# Patient Record
Sex: Male | Born: 1955 | Race: Black or African American | Hispanic: No | Marital: Single | Smoking: Never smoker
Health system: Southern US, Community
[De-identification: ages and names within clinical notes are randomized; demographics above are authoritative.]

## PROBLEM LIST (undated history)

## (undated) DIAGNOSIS — N400 Enlarged prostate without lower urinary tract symptoms: Secondary | ICD-10-CM

## (undated) HISTORY — PX: OTHER SURGICAL HISTORY: SHX169

---

## 2011-07-02 ENCOUNTER — Encounter (HOSPITAL_COMMUNITY): Payer: Self-pay | Admitting: *Deleted

## 2011-07-02 ENCOUNTER — Emergency Department (HOSPITAL_COMMUNITY)
Admission: EM | Admit: 2011-07-02 | Discharge: 2011-07-02 | Disposition: A | Discharge status: Home / Self Care | Payer: No Typology Code available for payment source | Attending: Emergency Medicine | Admitting: Emergency Medicine

## 2011-07-02 DIAGNOSIS — K644 Residual hemorrhoidal skin tags: Secondary | ICD-10-CM | POA: Insufficient documentation

## 2011-07-02 DIAGNOSIS — N390 Urinary tract infection, site not specified: Secondary | ICD-10-CM

## 2011-07-02 DIAGNOSIS — K5900 Constipation, unspecified: Secondary | ICD-10-CM | POA: Insufficient documentation

## 2011-07-02 DIAGNOSIS — R109 Unspecified abdominal pain: Secondary | ICD-10-CM | POA: Insufficient documentation

## 2011-07-02 LAB — URINALYSIS, ROUTINE W REFLEX MICROSCOPIC
Glucose, UA: NEGATIVE mg/dL
Ketones, ur: NEGATIVE mg/dL
Nitrite: NEGATIVE
Specific Gravity, Urine: 1.017 (ref 1.005–1.030)
pH: 5.5 (ref 5.0–8.0)

## 2011-07-02 LAB — URINE MICROSCOPIC-ADD ON

## 2011-07-02 MED ORDER — CEPHALEXIN 500 MG PO CAPS: 500.0000 mg | ORAL_CAPSULE | Freq: Four times a day (QID) | ORAL | Status: AC

## 2011-07-02 MED ORDER — POLYETHYLENE GLYCOL 3350 17 G PO PACK: 17.0000 g | PACK | Freq: Every day | ORAL | Status: AC

## 2011-07-02 NOTE — Discharge Instructions (Signed)
Take antibiotic as prescribed.  Take miralax once a day for the next five days or until you have had a normal bowel movement.  Continue to drink plenty of fluids and increase your fiber intake.  You should return to the ER if you develop fever, worsening pain, uncontrolled vomiting or prolonged constipation.  Constipation in Adults Constipation is having fewer than 2 bowel movements per week. Usually, the stools are hard. As we grow older, constipation is more common. If you try to fix constipation with laxatives, the problem may get worse. This is because laxatives taken over a long period of time make the colon muscles weaker. A low-fiber diet, not taking in enough fluids, and taking some medicines may make these problems worse. MEDICATIONS THAT MAY CAUSE CONSTIPATION  Water pills (diuretics).   Calcium channel blockers (used to control blood pressure and for the heart).   Certain pain medicines (narcotics).   Anticholinergics.   Anti-inflammatory agents.   Antacids that contain aluminum.  DISEASES THAT CONTRIBUTE TO CONSTIPATION  Diabetes.   Parkinson's disease.   Dementia.   Stroke.   Depression.   Illnesses that cause problems with salt and water metabolism.  HOME CARE INSTRUCTIONS   Constipation is usually best cared for without medicines. Increasing dietary fiber and eating more fruits and vegetables is the best way to manage constipation.   Slowly increase fiber intake to 25 to 38 grams per day. Whole grains, fruits, vegetables, and legumes are good sources of fiber. A dietitian can further help you incorporate high-fiber foods into your diet.   Drink enough water and fluids to keep your urine clear or pale yellow.   A fiber supplement may be added to your diet if you cannot get enough fiber from foods.   Increasing your activities also helps improve regularity.   Suppositories, as suggested by your caregiver, will also help. If you are using antacids, such as aluminum  or calcium containing products, it will be helpful to switch to products containing magnesium if your caregiver says it is okay.   If you have been given a liquid injection (enema) today, this is only a temporary measure. It should not be relied on for treatment of longstanding (chronic) constipation.   Stronger measures, such as magnesium sulfate, should be avoided if possible. This may cause uncontrollable diarrhea. Using magnesium sulfate may not allow you time to make it to the bathroom.  SEEK IMMEDIATE MEDICAL CARE IF:   There is bright red blood in the stool.   The constipation stays for more than 4 days.   There is belly (abdominal) or rectal pain.   You do not seem to be getting better.   You have any questions or concerns.  MAKE SURE YOU:   Understand these instructions.   Will watch your condition.   Will get help right away if you are not doing well or get worse.  Document Released: 11/23/2003 Document Revised: 02/13/2011 Document Reviewed: 01/28/2011 [facility] Patient Information 2012 [place], [place].

## 2011-07-02 NOTE — ED Provider Notes (Signed)
History     CSN: [id number]  Arrival date & time 07/02/11  1013   First MD Initiated Contact with Patient 07/02/11 1112      Chief Complaint  Patient presents with  . Abdominal Pain  . Constipation    (Consider location/radiation/quality/duration/timing/severity/associated sxs/prior treatment) HPI History provided by pt.   Pt developed diffuse, sharp pain across lower abdomen 5 days ago.   Pain had been constant but resolved and has not experienced any pain this morning.  Associated w/ constipation.  Has not had a BM in 3 days and for 3-4 days before that, his stools were small, hard balls.  Has not taken anything for symptoms but consumes large amt water and fiber.  Has not had fever, N/V, hematemesis/hematochezia/melena, urinary sx, urethral discharge.  No PMH other than mildly enlarged prostate.  No h/o abdominal surgeries.    History reviewed. No pertinent past medical history.  History reviewed. No pertinent past surgical history.  No family history on file.  History  Substance Use Topics  . Smoking status: Never Smoker   . Smokeless tobacco: Not on file  . Alcohol Use: Yes      Review of Systems  All other systems reviewed and are negative.    Allergies  Review of patient's allergies indicates no known allergies.  Home Medications   Current Outpatient Rx  Name Route Sig Dispense Refill  . BRIMONIDINE TARTRATE 0.1 % OP SOLN Both Eyes Place 1 drop into both eyes 2 (two) times daily.    Marland Kitchen LATANOPROST 0.005 % OP SOLN Both Eyes Place 1 drop into both eyes at bedtime.    Marland Kitchen TIMOLOL HEMIHYDRATE 0.5 % OP SOLN Left Eye Place 1 drop into the left eye daily.      BP 115/73  Pulse 74  Temp(Src) 98.6 F (37 C) (Oral)  Resp 20  Ht 5\' 7"  (1.702 m)  Wt 155 lb (70.308 kg)  BMI 24.28 kg/m2  SpO2 98%  Physical Exam  Nursing note and vitals reviewed. Constitutional: He is oriented to person, place, and time. He appears well-developed and well-nourished. No distress.    HENT:  Head: Normocephalic and atraumatic.  Eyes:       Normal appearance  Neck: Normal range of motion.  Cardiovascular: Normal rate and regular rhythm.   Pulmonary/Chest: Effort normal and breath sounds normal. No respiratory distress.  Abdominal: Soft. Bowel sounds are normal. He exhibits no distension and no mass. There is no tenderness. There is no rebound and no guarding.  Genitourinary:       One, non-thromboses external hemorrhoid.  No stool or blood in rectum.  Prostate does not seem to be enlarged.  Non-tender.   Musculoskeletal: Normal range of motion.  Neurological: He is alert and oriented to person, place, and time.  Skin: Skin is warm and dry. No rash noted.  Psychiatric: He has a normal mood and affect. His behavior is normal.    ED Course  Procedures (including critical care time)  Labs Reviewed  URINALYSIS, ROUTINE W REFLEX MICROSCOPIC - Abnormal; Notable for the following:    Hgb urine dipstick TRACE (*)    Leukocytes, UA SMALL (*)    All other components within normal limits  URINE MICROSCOPIC-ADD ON   No results found.   1. Urinary tract infection   2. Constipation       MDM  Healthy 56yo M, recently moved to area, presents w/ c/o lower abd pain and constipation.   Afebrile, NAD, abd benign/non-tender,  nml rectum/prostate on exam.  U/A positive for infection.  Pain may be d/t UTI or constipation. Doubt SBO; no h/o abd surgeries, no N/V, abd soft w/ NBS.  Doubt prostatitis; no urinary sx or low back pain, afebrile and prostate nml size/non-tender.  Pt d/c'd home w/ miralax and keflex as well as referral to [facility].  Return precautions discussed.         Arie Sabina [place], [place] 07/02/11 1616

## 2011-07-02 NOTE — ED Notes (Signed)
Patient with reported abd pain and constipation for 3 days.  Patient denies nausea.  Patient denies hx of same.  Patient has not eaten today

## 2011-07-02 NOTE — ED Notes (Signed)
Pt getting undressed and into a gown; warm blankets

## 2011-07-02 NOTE — ED Provider Notes (Signed)
Medical screening examination/treatment/procedure(s) were performed by non-physician practitioner and as supervising physician I was immediately available for consultation/collaboration.    Kyndall Amero L Yoana Staib, MD 07/02/11 2013 

## 2011-07-04 ENCOUNTER — Emergency Department (HOSPITAL_COMMUNITY)
Admission: EM | Admit: 2011-07-04 | Discharge: 2011-07-05 | Disposition: A | Discharge status: Home / Self Care | Payer: No Typology Code available for payment source | Attending: Emergency Medicine | Admitting: Emergency Medicine

## 2011-07-04 ENCOUNTER — Emergency Department (HOSPITAL_COMMUNITY): Payer: No Typology Code available for payment source

## 2011-07-04 ENCOUNTER — Encounter (HOSPITAL_COMMUNITY): Payer: Self-pay | Admitting: *Deleted

## 2011-07-04 DIAGNOSIS — K5900 Constipation, unspecified: Secondary | ICD-10-CM | POA: Insufficient documentation

## 2011-07-04 DIAGNOSIS — N134 Hydroureter: Secondary | ICD-10-CM | POA: Insufficient documentation

## 2011-07-04 DIAGNOSIS — R109 Unspecified abdominal pain: Secondary | ICD-10-CM | POA: Insufficient documentation

## 2011-07-04 DIAGNOSIS — N289 Disorder of kidney and ureter, unspecified: Secondary | ICD-10-CM | POA: Insufficient documentation

## 2011-07-04 DIAGNOSIS — R1032 Left lower quadrant pain: Secondary | ICD-10-CM | POA: Insufficient documentation

## 2011-07-04 DIAGNOSIS — R338 Other retention of urine: Secondary | ICD-10-CM | POA: Insufficient documentation

## 2011-07-04 LAB — COMPREHENSIVE METABOLIC PANEL
AST: 23 U/L (ref 0–37)
Albumin: 3.8 g/dL (ref 3.5–5.2)
BUN: 21 mg/dL (ref 6–23)
Chloride: 101 mEq/L (ref 96–112)
Creatinine, Ser: 2.51 mg/dL — ABNORMAL HIGH (ref 0.50–1.35)
Potassium: 3.6 mEq/L (ref 3.5–5.1)
Total Protein: 7.6 g/dL (ref 6.0–8.3)

## 2011-07-04 LAB — URINALYSIS, ROUTINE W REFLEX MICROSCOPIC
Glucose, UA: NEGATIVE mg/dL
Hgb urine dipstick: NEGATIVE
Leukocytes, UA: NEGATIVE
pH: 5.5 (ref 5.0–8.0)

## 2011-07-04 LAB — DIFFERENTIAL
Basophils Absolute: 0 10*3/uL (ref 0.0–0.1)
Basophils Relative: 0 % (ref 0–1)
Eosinophils Absolute: 0.1 10*3/uL (ref 0.0–0.7)
Monocytes Absolute: 0.7 10*3/uL (ref 0.1–1.0)
Monocytes Relative: 9 % (ref 3–12)
Neutro Abs: 5.9 10*3/uL (ref 1.7–7.7)
Neutrophils Relative %: 72 % (ref 43–77)

## 2011-07-04 LAB — CBC
Hemoglobin: 13.2 g/dL (ref 13.0–17.0)
MCH: 29.2 pg (ref 26.0–34.0)
MCHC: 33.2 g/dL (ref 30.0–36.0)
RDW: 12.1 % (ref 11.5–15.5)

## 2011-07-04 MED ORDER — SODIUM CHLORIDE 0.9 % IV SOLN
INTRAVENOUS | Status: DC
  Administered 2011-07-04: 125 mL/h via INTRAVENOUS
  Administered 2011-07-04: 19:00:00 via INTRAVENOUS

## 2011-07-04 MED ORDER — LIDOCAINE HCL 2 % EX GEL
CUTANEOUS | Status: AC
  Filled 2011-07-04: qty 20

## 2011-07-04 MED ORDER — IOHEXOL 300 MG/ML  SOLN
100.0000 mL | Freq: Once | INTRAMUSCULAR | Status: AC | PRN
  Administered 2011-07-04: 100 mL via INTRAVENOUS

## 2011-07-04 MED ORDER — ONDANSETRON HCL 4 MG/2ML IJ SOLN
4.0000 mg | Freq: Once | INTRAMUSCULAR | Status: AC
  Administered 2011-07-04: 4 mg via INTRAVENOUS
  Filled 2011-07-04: qty 2

## 2011-07-04 NOTE — ED Notes (Signed)
Pt was seen for constipation and UTI on Wednesday.  Pt continues to have constipation, only able to make small little hard "droplets".  Pt feels that his abdomen is distended.  No relief from constipation with laxatives at home.  No nausea or vomiting with this

## 2011-07-04 NOTE — ED Provider Notes (Signed)
History     CSN: [id number]  Arrival date & time 07/04/11  1428   First MD Initiated Contact with Patient 07/04/11 1728      Chief Complaint  Patient presents with  . Constipation    (Consider location/radiation/quality/duration/timing/severity/associated sxs/prior treatment) HPI Comments: Alan Brewer is a 56 y.o. Male with abdominal swelling, constipation, decreased stooling, urinary hesitancy, and decreased oral intake. He has been symptomatic for several days. He's been using MiraLAX recommended after an emergency department visit without relief. He has never had this problem before. He is taking over-the-counter vitamins and iron in them.   Patient is a 56 y.o. male presenting with constipation. The history is provided by the patient.  Constipation     History reviewed. No pertinent past medical history.  History reviewed. No pertinent past surgical history.  No family history on file.  History  Substance Use Topics  . Smoking status: Never Smoker   . Smokeless tobacco: Not on file  . Alcohol Use: Yes      Review of Systems  Gastrointestinal: Positive for constipation.  All other systems reviewed and are negative.    Allergies  Review of patient's allergies indicates no known allergies.  Home Medications   Current Outpatient Rx  Name Route Sig Dispense Refill  . BRIMONIDINE TARTRATE 0.1 % OP SOLN Both Eyes Place 1 drop into both eyes 2 (two) times daily.    . CEPHALEXIN 500 MG PO CAPS Oral Take 1 capsule (500 mg total) by mouth 4 (four) times daily. 28 capsule 0  . LATANOPROST 0.005 % OP SOLN Both Eyes Place 1 drop into both eyes at bedtime.    Marland Kitchen POLYETHYLENE GLYCOL 3350 PO PACK Oral Take 17 g by mouth daily. 5 each 0  . TIMOLOL HEMIHYDRATE 0.5 % OP SOLN Left Eye Place 1 drop into the left eye daily.    Marland Kitchen CIPROFLOXACIN HCL 500 MG PO TABS Oral Take 1 tablet (500 mg total) by mouth every 12 (twelve) hours. 20 tablet 0    BP 123/71  Pulse 70   Temp(Src) 99.2 F (37.3 C) (Oral)  Resp 16  SpO2 96%  Physical Exam  Nursing note and vitals reviewed. Constitutional: He is oriented to person, place, and time. He appears well-developed and well-nourished.  HENT:  Head: Normocephalic and atraumatic.  Right Ear: External ear normal.  Left Ear: External ear normal.  Eyes: Conjunctivae and EOM are normal. Pupils are equal, round, and reactive to light.  Neck: Normal range of motion and phonation normal. Neck supple.  Cardiovascular: Normal rate, regular rhythm, normal heart sounds and intact distal pulses.   Pulmonary/Chest: Effort normal and breath sounds normal. He exhibits no bony tenderness.  Abdominal: Soft. Normal appearance and bowel sounds are normal. There is no tenderness.       Distended, soft.  Musculoskeletal: Normal range of motion.  Neurological: He is alert and oriented to person, place, and time. He has normal strength. No cranial nerve deficit or sensory deficit. He exhibits normal muscle tone. Coordination normal.  Skin: Skin is warm, dry and intact.  Psychiatric: He has a normal mood and affect. His behavior is normal. Judgment and thought content normal.    ED Course  Procedures (including critical care time)  Labs Reviewed  COMPREHENSIVE METABOLIC PANEL - Abnormal; Notable for the following:    Glucose, Bld 112 (*)    Creatinine, Ser 2.51 (*)    GFR calc non Af Amer 27 (*)    GFR calc Af  Amer 31 (*)    All other components within normal limits  CBC  DIFFERENTIAL  URINALYSIS, ROUTINE W REFLEX MICROSCOPIC  URINE CULTURE   Ct Abdomen Pelvis W Contrast  07/04/2011  *RADIOLOGY REPORT*  Clinical Data: No BM for 1 week  CT ABDOMEN AND PELVIS WITH CONTRAST  Technique:  Multidetector CT imaging of the abdomen and pelvis was performed following the standard protocol during bolus administration of intravenous contrast.  Contrast:  100 ml of omni 300  Comparison: None  Findings: The lung bases appear clear.  No  pericardial or pleural effusion.  Several low density structures are noted within the liver parenchyma.  These are too small to characterize.  The gallbladder is normal.  There is no biliary dilatation.  The pancreas is unremarkable.  Negative spleen.  Both adrenal glands are normal.  There are two small stones within the right upper and lower pole collecting system.  Bilateral peri nephric fat stranding is noted. Within the upper pole of the left kidney there is a 7.6 mm hypodense structure.  Too small to reliably characterize.  There is bilateral hydronephrosis and hydroureter which leads up to the urinary bladder. No ureteral calculi identified. There is moderate to marked distention of the urinary bladder which exhibits diffuse wall thickening.  Prostate gland appears irregular and enlarged. Suspect bladder outlet obstruction.   Within the dependent portion of the bladder there is a tiny 2-3 mm stone, image 65.  No upper abdominal adenopathy present.  There is no pelvic or inguinal adenopathy.  The stomach appears normal.  The small bowel loops are normal in caliber.  No evidence for small bowel obstruction.  There is a moderate desiccated stool within the proximal colon.  No evidence for large bowel obstruction.  There is however mass effect of the distended bladder onto the sigmoid colon.  Small amount of free fluid is noted within the pelvis.  Review of the visualized osseous structures is unremarkable.  IMPRESSION:  1.  Suspect a bladder outlet obstruction.  This may be chronic. The urinary bladder is moderate to markedly distended and there is mild diffuse wall thickening of the bladder. The distended urinary bladder does exhibit mass effect upon the sigmoid colon which may account for the change in bowel habits.  2.  Bilateral hydronephrosis and perinephric fat stranding is likely secondary to chronic bladder outlet obstruction. 3.  Small nonobstructing calculi are noted within the right renal collecting  system.  A third stone is identified within the dependent portion of the bladder. 4.  No evidence for acute bowel pathology.  Specifically there is no evidence for colitis or bowel obstruction.  Original Report Authenticated By: Rosealee Albee, M.D.   Dg Abd 2 Views  07/04/2011  *RADIOLOGY REPORT*  Clinical Data: Left lower quadrant pain.  Abdominal distention.  ABDOMEN - 2 VIEW  Comparison: None.  Findings: Several mildly dilated small bowel loops are seen in the upper abdomen with air-fluid levels.  There is a paucity of colonic gas.  This could represent a focal ileus or a partial proximal to mid small bowel obstruction.  There is no evidence of free air.  No radiopaque calculi identified.  Pelvic phleboliths are noted as well as ingested radiopaque substance in the right abdomen.  IMPRESSION: Focal abdominal small bowel ileus, versus partial proximal to mid small bowel obstruction.  Original Report Authenticated By: Danae Orleans, M.D.     1. Acute urinary retention    2. Acute Renal Insufficiency  MDM  Abdominal distension with constipation and urinary hesitancy. Under treatment for UTI. GI sx with non-diagnostic plain imaging, required CT A/P. He has renal insufficiency, apparently new, and bladder distension. He will need bladder drainage prior to d/c and appropriate f/u planning with Urology. Placed in CDU on Holding status by me.           Flint Melter, MD 07/05/11 [phone]

## 2011-07-04 NOTE — ED Notes (Signed)
Pt back from CT

## 2011-07-04 NOTE — ED Notes (Signed)
Patient states has not had a BM for one week seen in ED two days ago given medication. Returned today for general abdominal pain more on the right then left.  Abdomen moderate firm and distended. Hypoactive bowel sounds.  Patient states eating without incident.  Did have two bowel movement formed stated small amounts. Airway intact bilateral equal chest rise and fall wife at bedside.

## 2011-07-04 NOTE — ED Notes (Signed)
Foley cath inserted with no urine return at this time.  Pt had just voided prior to foley cath placement.  Wife at bedside.

## 2011-07-04 NOTE — ED Notes (Signed)
Pt continues to not have any urine.  Felicie Morn NP made aware.  Coude cath ordered and Dr. Patria Mane will attempt to insert.

## 2011-07-05 LAB — URINE CULTURE
Colony Count: NO GROWTH
Culture  Setup Time: 201304261849
Culture: NO GROWTH

## 2011-07-05 MED ORDER — CIPROFLOXACIN HCL 500 MG PO TABS: 500.0000 mg | ORAL_TABLET | Freq: Two times a day (BID) | ORAL | Status: AC

## 2011-07-05 MED ORDER — CIPROFLOXACIN HCL 500 MG PO TABS
500.0000 mg | ORAL_TABLET | Freq: Once | ORAL | Status: AC
  Administered 2011-07-05: 500 mg via ORAL
  Filled 2011-07-05: qty 1

## 2011-07-05 NOTE — ED Notes (Signed)
Coude Cath inserted by Dr. Patria Mane with 1200 urine return.  Leg bag placed on pt and instructed on how to use. Pt and wife verbalizes understanding

## 2011-07-05 NOTE — ED Provider Notes (Signed)
Patient moved to CDU from triage for continuation of treatment of urinary retention and constipation.  Nursing tech attempted to insert foley cath, did not get any urine return from catheter, but has frank blood from urethra.  Suspect catheter did not pass by prostate.  Discussed with Dr. Kayleen Memos evaluated with bedside ultrasound, bladder continues to remain distended.  Coude catheter inserted by Dr. Patria Mane with return of large amount of yellow urine.  Patient will be started on cipro for 10 days.  Patient to follow-up with Dr. Vernie Ammons on Monday.  Jimmye Norman, NP 07/05/11 [phone]

## 2011-07-05 NOTE — ED Provider Notes (Signed)
Medical screening examination/treatment/procedure(s) were conducted as a shared visit with non-physician practitioner(s) and myself.  I personally evaluated the patient during the encounter  The patient appears to have urinary retention as the cause of his symptoms.  A coud Foley catheter was placed with return of approximately 1 L of yellow urine.  Patient be switched to 10 days of ciprofloxacin.  Urology followup.  Urine culture sent.  His abdominal distention resolved with placement of the Foley.  I suspect his urinary retention as been the cause of his constipation.  PROCEDURE NOTE: Foley Catheter placement A 16 French coud Foley catheter was placed under normal sterile conditions.  10 cc of saline were placed and the balloon.  The patient tolerated the procedure well without any complications.  Return her 1 L of yellow urine.  Foley catheter placed to leg bag.  Lyanne Co, MD 07/05/11 [phone]

## 2011-07-05 NOTE — Discharge Instructions (Signed)
Acute Urinary Retention, Male  You have been seen by a caregiver today because of your inability to urinate (pass your water).  This is a common problem in elderly males. As men age their prostates become larger and block the flow of urine from the bladder. This is usually a problem that has come on gradually. It is often first noticed by having to get up at night to urinate. This is because as the prostate enlarges it is more difficult to empty the bladder completely.  Treatment may involve a one time catheterization to empty the bladder. This is putting in a tube to drain your urine. Then you and your personal caregiver can decide at your earliest convenience how to handle this problem in the future. It may also be a problem that may not recur for years. Sometimes this problem can be caused by medications. In this case, all that is often necessary is to discontinue the offending agent.  If you are to leave the foley catheter (a long, narrow, hollow tube) in and go home with a drainage system, you will need to discuss the best course of action with your caregiver. While the catheter is in, maintain a good intake of fluids. Keep the drainage bag emptied and lower than your catheter. This is so contaminated (infected) urine will not be flowing back into your bladder. This could lead to a urinary tract infection.  Only take over-the-counter or prescription medicines for pain, discomfort, or fever as directed by your caregiver.   SEEK IMMEDIATE MEDICAL CARE IF:   You develop chills, fever, or show signs of generalized illness that occurs prior to seeing your caregiver.  Document Released: 06/02/2000 Document Revised: 02/13/2011 Document Reviewed: 02/16/2008  [employer] Patient Information 2012 [employer]      Foley Catheter Care, Adult  A soft, flexible tube (Foley catheter) has been placed in your bladder. This may be done to temporarily help with urine drainage after an operation or to relieve blockage from an  enlarged prostate gland.  HOME CARE INSTRUCTIONS   If you are going home with a Foley catheter in place, follow these instructions:  Taking Care of the Catheter:   Keep the area where the catheter leaves your body clean.   Attach the catheter to the leg so there is no tension on the catheter.   Keep the drainage bag below the level of the bladder, but keep it OFF the floor.   Do not take long soaking baths. Your caregiver will give instructions about showering.   Wash your hands before touching ANYTHING related to the catheter or bag.   Using mild soap and warm water on a washcloth:   Clean the area closest to the catheter insertion site using a circular motion around the catheter.   Clean the catheter itself by wiping AWAY from the insertion site for several inches down the tube.   NEVER wipe upward as this could sweep bacteria up into the urethra (tube in your body that normally drains the bladder) and cause infection.  Taking Care of the Drainage Bags:   Two drainage bags will be taken home: a large overnight drainage bag, and a smaller leg bag which fits underneath clothing.   It is okay to wear the overnight bag at any time, but NEVER wear the smaller leg bag at night.   Keep the drainage bag well below the level of your bladder. This prevents backflow of urine into the bladder and allows the urine to drain   or a leg strap provided by the hospital.   Empty the drainage bag when it is  to  full. Wash your hands before and after touching the bag.   Periodically check the tubing for kinks to make sure there is no pressure on the tubing which could restrict the flow of urine.  Changing the Drainage Bags:  Cleanse both ends of the clean bag with alcohol before changing.   Pinch off the rubber catheter to avoid urine spillage during the disconnection.   Disconnect the dirty bag and  connect the clean one.   Empty the dirty bag carefully to avoid a urine spill.   Attach the new bag to the leg with tape or a leg strap.  Cleaning the Drainage Bags:  Whenever a drainage bag is disconnected, it must be cleaned quickly so it is ready for the next use.   Wash the bag in warm, soapy water.   Rinse the bag thoroughly with warm water.   Soak the bag for 30 minutes in a solution of white vinegar and water (1 cup vinegar to 1 quart warm water).   Rinse with warm water.  SEEK MEDICAL CARE IF:   Some pain develops in the kidney (lower back) area.   The urine is cloudy or smells bad.   There is some blood in the urine.   The catheter becomes clogged and/or there is no urine drainage.  SEEK IMMEDIATE MEDICAL CARE IF:   You have moderate or severe pain in the kidney region.   You start to throw up (vomit).   Blood fills the tube.   Worsening belly (abdominal) pain develops.   You have a fever.  MAKE SURE YOU:   Understand these instructions.   Will watch your condition.   Will get help right away if you are not doing well or get worse.  Document Released: 02/24/2005 Document Revised: 02/13/2011 Document Reviewed: 08/21/2006 [facility] Patient Information 2012 [place], [place].  RESOURCE GUIDE     No Primary Care Doctor Call [facility]  [phone] Other agencies that provide inexpensive medical care    [facility]  [phone]    [facility]  [phone]    [facility]  [phone]    [facility]  [phone]    [facility]  [phone]    [facility]  [phone]

## 2013-10-03 IMAGING — CR DG ABDOMEN 2V
2 series · 2 of 2 positions shown · non-contrast
Comparison: None.

CLINICAL DATA: Left lower quadrant pain.  Abdominal distention.

ABDOMEN - 2 VIEW

[t abdomen supine]
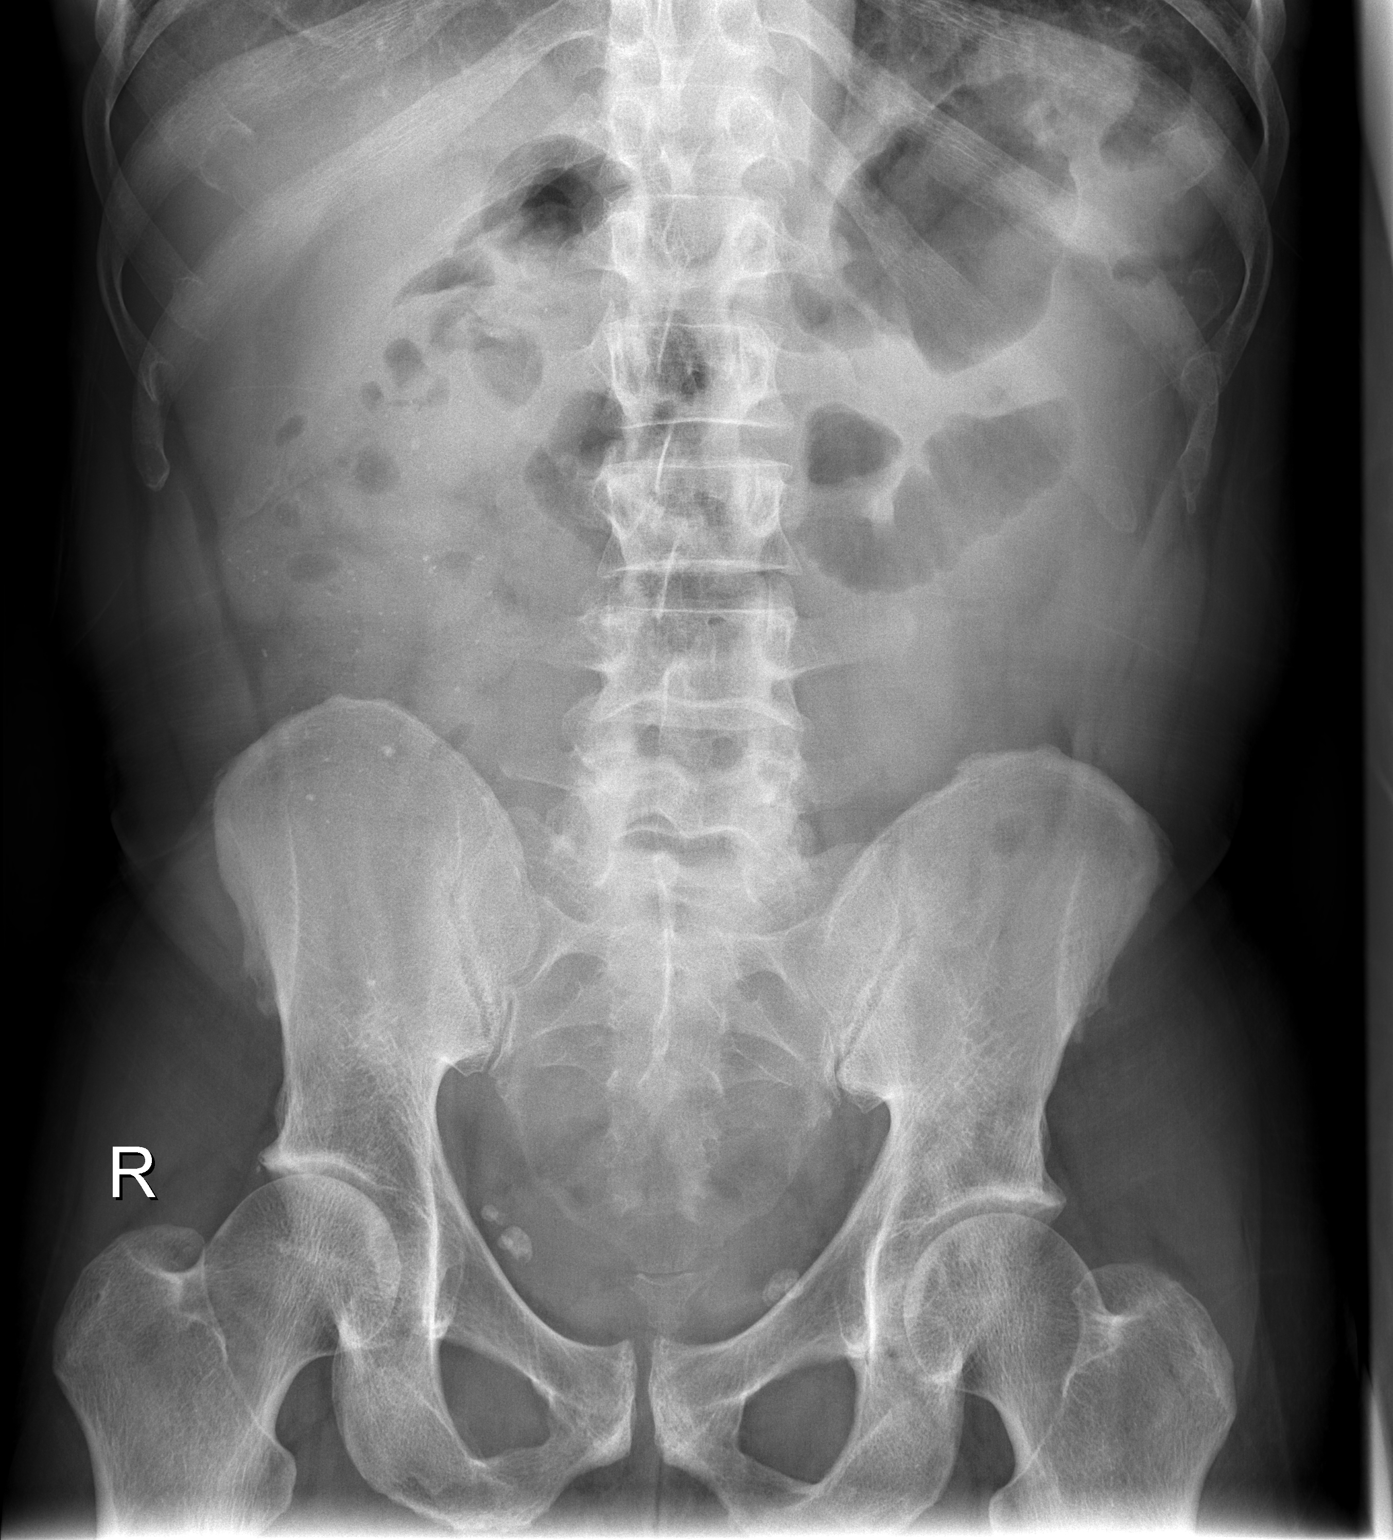

[w abdomen upright]
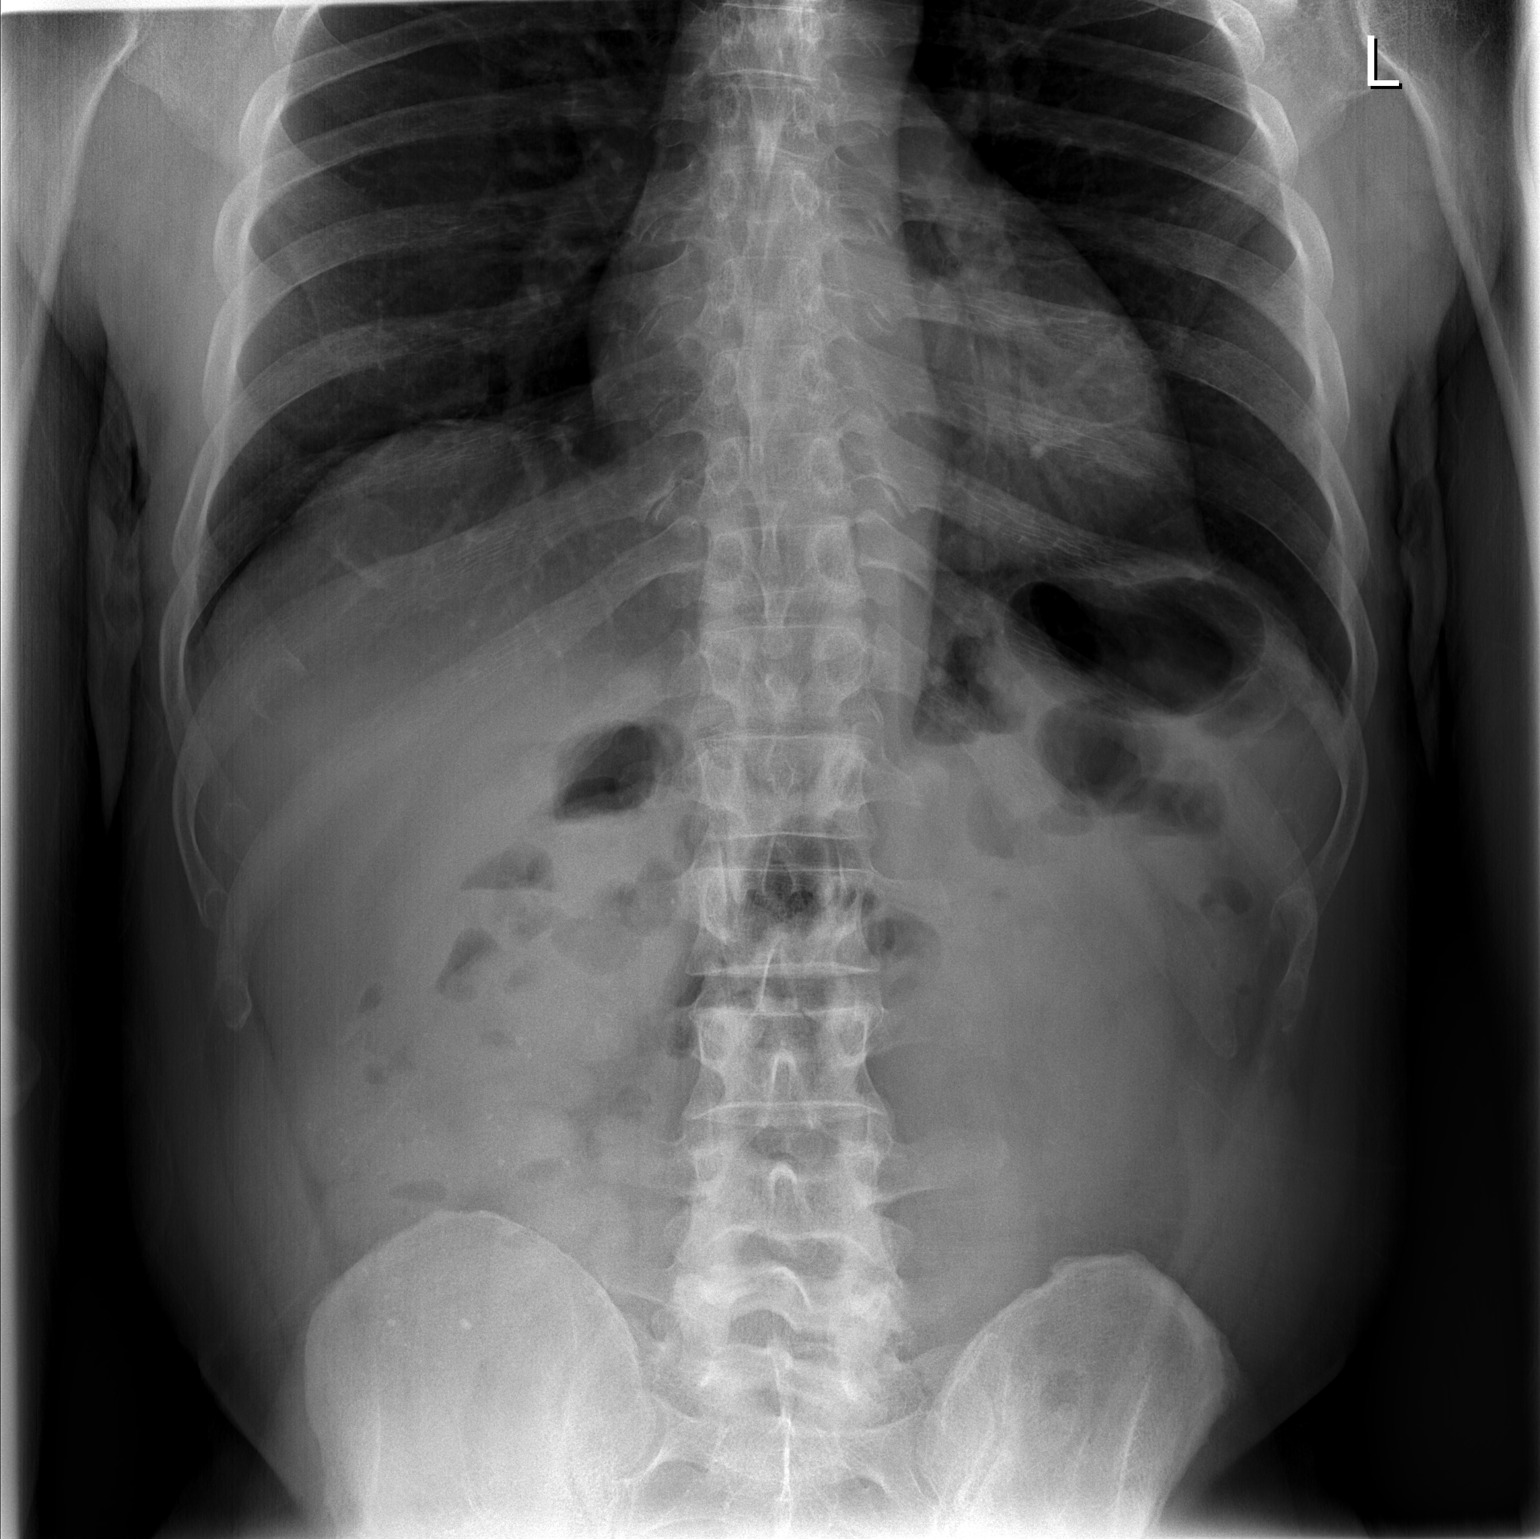

[2 of 2 positions shown; findings below may reference images not displayed]

FINDINGS: Several mildly dilated small bowel loops are seen in the
upper abdomen with air-fluid levels.  There is a paucity of colonic
gas.  This could represent a focal ileus or a partial proximal to
mid small bowel obstruction.  There is no evidence of free air.  No
radiopaque calculi identified.  Pelvic phleboliths are noted as
well as ingested radiopaque substance in the right abdomen.
IMPRESSION: Focal abdominal small bowel ileus, versus partial proximal to mid
small bowel obstruction.

## 2015-07-03 ENCOUNTER — Other Ambulatory Visit: Payer: Self-pay | Admitting: Urology

## 2015-07-17 NOTE — Patient Instructions (Addendum)
Tana FeltsJessie Lebron  07/17/2015   Your procedure is scheduled on: 07-24-15  [facility]  [facility] 3rd floor to  Short Stay Center at 530  AM.  Call this number if you have problems the morning of surgery [phone]   Remember: ONLY 1 PERSON MAY GO WITH YOU TO SHORT STAY TO GET  READY MORNING OF YOUR SURGERY.  Do not eat food or drink liquids :After Midnight.     Take these medicines the morning of surgery with A SIP OF WATER: eye drop, finasteride (proscar), tamsulosin (flomax)             You may not have any metal on your body including hair pins and              piercings  Do not wear jewelry, make-up, lotions, powders or perfumes, deodorant             Do not wear nail polish.  Do not shave  48 hours prior to surgery.              Men may shave face and neck.   Do not bring valuables to the hospital. [place] IS NOT             RESPONSIBLE   FOR VALUABLES.  Contacts, dentures or bridgework may not be worn into surgery.  Leave suitcase in the car. After surgery it may be brought to your room.     Patients discharged the day of surgery will not be allowed to drive home.  Name and phone number of your driver:  Special Instructions: N/A              Please read over the following fact sheets you were given: _____________________________________________________________________             [facility] - Preparing for [facility] Before surgery, you can play an important role.  Because skin is not sterile, your skin needs to be as free of germs as possible.  You can reduce the number of germs on your skin by washing with CHG (chlorahexidine gluconate) soap before surgery.  CHG is an antiseptic cleaner which kills germs and bonds with the skin to continue killing germs even after washing. Please DO NOT use if you have an allergy to CHG or antibacterial soaps.  If your skin becomes reddened/irritated stop using the CHG and  inform your nurse when you arrive at Short Stay. Do not shave (including legs and underarms) for at least 48 hours prior to the first CHG shower.  You may shave your face/neck. Please follow these instructions carefully:  1.  Shower with CHG Soap the night before surgery and the  morning of Surgery.  2.  If you choose to wash your hair, wash your hair first as usual with your  normal  shampoo.  3.  After you shampoo, rinse your hair and body thoroughly to remove the  shampoo.                           4.  Use CHG as you would any other liquid soap.  You can apply chg directly  to the skin and wash                       Gently with a  scrungie or clean washcloth.  5.  Apply the CHG Soap to your body ONLY FROM THE NECK DOWN.   Do not use on face/ open                           Wound or open sores. Avoid contact with eyes, ears mouth and genitals (private parts).                       Wash face,  Genitals (private parts) with your normal soap.             6.  Wash thoroughly, paying special attention to the area where your surgery  will be performed.  7.  Thoroughly rinse your body with warm water from the neck down.  8.  DO NOT shower/wash with your normal soap after using and rinsing off  the CHG Soap.                9.  Pat yourself dry with a clean towel.            10.  Wear clean pajamas.            11.  Place clean sheets on your bed the night of your first shower and do not  sleep with pets. Day of Surgery : Do not apply any lotions/deodorants the morning of surgery.  Please wear clean clothes to the hospital/surgery center.  FAILURE TO FOLLOW THESE INSTRUCTIONS MAY RESULT IN THE CANCELLATION OF YOUR SURGERY PATIENT SIGNATURE_________________________________  NURSE SIGNATURE__________________________________  ________________________________________________________________________

## 2015-07-19 ENCOUNTER — Encounter (HOSPITAL_COMMUNITY)
Admission: RE | Admit: 2015-07-19 | Discharge: 2015-07-19 | Disposition: A | Discharge status: Home / Self Care | Payer: 59 | Source: Ambulatory Visit | Attending: Urology | Admitting: Urology

## 2015-07-19 ENCOUNTER — Encounter (HOSPITAL_COMMUNITY): Payer: Self-pay

## 2015-07-19 DIAGNOSIS — Z01812 Encounter for preprocedural laboratory examination: Secondary | ICD-10-CM | POA: Diagnosis not present

## 2015-07-19 DIAGNOSIS — N400 Enlarged prostate without lower urinary tract symptoms: Secondary | ICD-10-CM | POA: Insufficient documentation

## 2015-07-19 HISTORY — DX: Benign prostatic hyperplasia without lower urinary tract symptoms: N40.0

## 2015-07-19 LAB — CBC
HCT: 41.2 % (ref 39.0–52.0)
Hemoglobin: 13.4 g/dL (ref 13.0–17.0)
MCH: 28.6 pg (ref 26.0–34.0)
MCHC: 32.5 g/dL (ref 30.0–36.0)
MCV: 88 fL (ref 78.0–100.0)
PLATELETS: 206 10*3/uL (ref 150–400)
RBC: 4.68 MIL/uL (ref 4.22–5.81)
RDW: 12.4 % (ref 11.5–15.5)
WBC: 5.2 10*3/uL (ref 4.0–10.5)

## 2015-07-19 LAB — BASIC METABOLIC PANEL
ANION GAP: 9 (ref 5–15)
BUN: 13 mg/dL (ref 6–20)
CALCIUM: 9.4 mg/dL (ref 8.9–10.3)
CO2: 26 mmol/L (ref 22–32)
Chloride: 107 mmol/L (ref 101–111)
Creatinine, Ser: 0.96 mg/dL (ref 0.61–1.24)
GFR calc Af Amer: >60 mL/min (ref >60)
GLUCOSE: 96 mg/dL (ref 65–99)
POTASSIUM: 4.1 mmol/L (ref 3.5–5.1)
SODIUM: 142 mmol/L (ref 135–145)

## 2015-07-23 NOTE — H&P (Signed)
History of Present Illness                 F/u -       1-BPH- h/o AUR.     -May 2013 AUR - failed void trial and underwent cystoscopy and the foley catheter was not replaced following the procedure.   Cysto - lateral and median lobe hypertrophy. Normal DRE.   Prostate US = 66 g prostate with median lobe. Renal US showed no right hydro and mild left hydro. BUN 7, Cr 1.2 after foley drainage.   After that time, he had frequent, small volume voids only. Came back with PVR >53600ml, foley catheter replaced. Increased his tamsulosin 0.4mg  to 2 capsules po qhs. Passed void trial.    -Jan 2014 added finasteride to tamsulosin, weak stream  -Feb 2017 - on finasteride and tamsulosin      2-PCa screening- no FH PCa. No prostate bx.   -May 2012 PSA 4.6  -May 2013 normal DRE, 66 gram prostate on U/s   -Jan 2014 PSA 2.34 - on finasteride  -Jan 2015 PSA 2.96   -Feb 2017 normal DRE   -Apr 2017 PSA 2.6      3-  Impotence -  -Jan 2014 erections worse on finasteride. Robinette Hainesried Stendra which didn't work well. Tried Cialis 10-20 mg. He is using Cialis 20 mg. He gets a partial response. Did well with generic sildenafil.   Current: sildenafil 20 mg tablets with good result        April 2017 interval history  Patient returned to continue management of BPH with lower urinary tract symptoms. He returns today for cystoscopy. He continues combination therapy. His current PSA is 2.6 and looking back that appears stable.    Past Medical History Problems  1. History of glaucoma (J81.19(Z86.69)  Surgical History Problems  1. History of No Surgical Problems  Current Meds 1. Brimonidine Tartrate SOLN;  Therapy: (Recorded:02May2013) to Recorded 2. Finasteride 5 MG Oral Tablet; Take 1 tablet by mouth daily;  Therapy: 17Jan2014 to (Evaluate:08Feb2018)  Requested for: 13Feb2017; Last  Rx:13Feb2017 Ordered 3. Latanoprost 0.005 % Ophthalmic Solution;  Therapy: (Recorded:02May2013) to  Recorded 4. Sildenafil Citrate 20 MG Oral Tablet; take  2   -   5  tablets as needed and  instructed;  Therapy: 09Jan2015 to (Last Rx:09Jan2015) Ordered 5. Tamsulosin HCl - 0.4 MG Oral Capsule; TAKE 2 CAPSULES BY MOUTH DAILY 30  MINUTES AFTER THE SAME MEAL;  Therapy: 14May2013 to (Evaluate:14May2017)  Requested for: 13Feb2017; Last  Rx:13Feb2017 Ordered 6. Timolol Maleate 0.5 % Ophthalmic Gel Forming Solution;  Therapy: (Recorded:02May2013) to Recorded 7. Viagra 100 MG Oral Tablet; TAKE 1/2 TO 1 TABLET ONCEDAILY AS NEEDED;  Therapy: 16Jan2015 to (Evaluate:22Apr2017)  Requested for: 23Feb2017; Last  Rx:21Feb2017 Ordered  Allergies Medication  1. No Known Drug Allergies  Family History Problems  1. Family history of Colon Cancer : Father 2. Family history of Glaucoma : Mother  Social History Problems  1. Alcohol Use (History) 2. Denied: History of Caffeine Use 3. Marital History - Currently Married 4. Never A Smoker 5. Occupation:   Psychologist, educationalT consultant 6. Denied: History of Tobacco Use  Vitals Vital Signs [Data Includes: Last 1 Day]  Recorded: 20Apr2017 01:49PM  Height: 5 ft 7 in Weight: 160 lb  BMI Calculated: 25.06 BSA Calculated: 1.84 Blood Pressure: 114 / 71 Temperature: 97.6 F Heart Rate: 64  Physical Exam Constitutional: Well nourished and well developed . No acute distress.  Pulmonary: No respiratory distress and normal  respiratory rhythm and effort.  Cardiovascular: Heart rate and rhythm are normal . No peripheral edema.  Neuro/Psych:. Mood and affect are appropriate.    Results/Data Urine [Data Includes: Last 1 Day]   20Apr2017  COLOR YELLOW   APPEARANCE CLEAR   SPECIFIC GRAVITY 1.025   pH 6.0   GLUCOSE NEGATIVE   BILIRUBIN NEGATIVE   KETONE NEGATIVE   BLOOD NEGATIVE   PROTEIN NEGATIVE   NITRITE NEGATIVE   LEUKOCYTE ESTERASE NEGATIVE    The following images/tracing/specimen were independently visualized: Marland Kitchen    Procedure  Procedure: Cystoscopy    Informed Consent: Risks, benefits, and potential adverse events were discussed and informed consent was obtained from the patient.  Prep: The patient was prepped with betadine.  Antibiotic prophylaxis: Ciprofloxacin.  Procedure Note:  Urethral meatus:. No abnormalities.  Anterior urethra: No abnormalities.  Prostatic urethra: No abnormalities . An enlarged intravesical median lobe was visualized.  Bladder: Visulization was clear. The ureteral orifices were in the normal anatomic position bilaterally and had clear efflux of urine. A systematic survey of the bladder demonstrated no bladder tumors or stones. The mucosa was smooth without abnormalities. The patient tolerated the procedure well.  Complications: None.    Assessment Assessed  1. Benign prostatic hyperplasia with urinary obstruction (N40.1,N13.8)  Plan Benign prostatic hyperplasia with urinary obstruction  1. Follow-up Schedule Surgery Office  Follow-up  Status: Hold For - Appointment   Requested for: 20Apr2017  Discussion/Summary          BPH-patient stable on tamsulosin and finasteride. Based on cystoscopy today the lateral lobes were quite open although they could collapse with resection of the median lobe but I think he did quite well with median lobe resection with the bipolar. We did discuss our to undergo such as the greenlight laser although the greenlight this to have quicker recovery and less bleeding and has a higher re-intervention rate. We discussed risks such as bleeding, infection, incontinence and stricture among others. All questions answered. We'll proceed with bipolar TURP.    PCa screen - stable       Signatures Electronically signed by : Jerilee Field, M.D.; Jun 28 2015  2:32PM EST

## 2015-07-24 ENCOUNTER — Ambulatory Visit (HOSPITAL_COMMUNITY): Payer: 59 | Admitting: Certified Registered Nurse Anesthetist

## 2015-07-24 ENCOUNTER — Observation Stay (HOSPITAL_COMMUNITY)
Admission: RE | Admit: 2015-07-24 | Discharge: 2015-07-25 | Disposition: A | Discharge status: Home / Self Care | Payer: 59 | Source: Ambulatory Visit | Attending: Urology | Admitting: Urology

## 2015-07-24 ENCOUNTER — Encounter (HOSPITAL_COMMUNITY)
Admission: RE | Disposition: A | Discharge status: Home / Self Care | Payer: Self-pay | Source: Ambulatory Visit | Attending: Urology

## 2015-07-24 ENCOUNTER — Encounter (HOSPITAL_COMMUNITY): Payer: Self-pay | Admitting: *Deleted

## 2015-07-24 DIAGNOSIS — N138 Other obstructive and reflux uropathy: Secondary | ICD-10-CM | POA: Insufficient documentation

## 2015-07-24 DIAGNOSIS — Z79899 Other long term (current) drug therapy: Secondary | ICD-10-CM | POA: Diagnosis not present

## 2015-07-24 DIAGNOSIS — N401 Enlarged prostate with lower urinary tract symptoms: Principal | ICD-10-CM | POA: Diagnosis present

## 2015-07-24 HISTORY — PX: TRANSURETHRAL RESECTION OF PROSTATE: SHX73

## 2015-07-24 SURGERY — TRANSURETHRAL RESECTION OF THE PROSTATE WITH GYRUS INSTRUMENTS
Anesthesia: General

## 2015-07-24 MED ORDER — MEPERIDINE HCL 50 MG/ML IJ SOLN: 6.2500 mg | INTRAMUSCULAR | Status: DC | PRN

## 2015-07-24 MED ORDER — ACETAMINOPHEN 325 MG PO TABS: 650.0000 mg | ORAL_TABLET | ORAL | Status: DC | PRN

## 2015-07-24 MED ORDER — CEPHALEXIN 500 MG PO CAPS: 500.0000 mg | ORAL_CAPSULE | Freq: Two times a day (BID) | ORAL | Status: AC

## 2015-07-24 MED ORDER — SODIUM CHLORIDE 0.9 % IR SOLN
3000.0000 mL | Status: DC
  Administered 2015-07-24 – 2015-07-25 (×2): 3000 mL

## 2015-07-24 MED ORDER — MIDAZOLAM HCL 2 MG/2ML IJ SOLN
INTRAMUSCULAR | Status: AC
  Filled 2015-07-24: qty 2

## 2015-07-24 MED ORDER — OXYCODONE HCL 5 MG/5ML PO SOLN
5.0000 mg | Freq: Once | ORAL | Status: DC | PRN
  Filled 2015-07-24: qty 5

## 2015-07-24 MED ORDER — CEPHALEXIN 500 MG PO CAPS
500.0000 mg | ORAL_CAPSULE | Freq: Two times a day (BID) | ORAL | Status: DC
  Administered 2015-07-24 – 2015-07-25 (×3): 500 mg via ORAL
  Filled 2015-07-24 (×3): qty 1

## 2015-07-24 MED ORDER — EPHEDRINE SULFATE 50 MG/ML IJ SOLN
INTRAMUSCULAR | Status: DC | PRN
  Administered 2015-07-24 (×4): 5 mg via INTRAVENOUS

## 2015-07-24 MED ORDER — ZOLPIDEM TARTRATE 5 MG PO TABS: 5.0000 mg | ORAL_TABLET | Freq: Every evening | ORAL | Status: DC | PRN

## 2015-07-24 MED ORDER — ONDANSETRON HCL 4 MG/2ML IJ SOLN
INTRAMUSCULAR | Status: DC | PRN
  Administered 2015-07-24: 4 mg via INTRAVENOUS

## 2015-07-24 MED ORDER — HYOSCYAMINE SULFATE 0.125 MG SL SUBL
0.1250 mg | SUBLINGUAL_TABLET | SUBLINGUAL | Status: DC | PRN
  Filled 2015-07-24: qty 1

## 2015-07-24 MED ORDER — MIDAZOLAM HCL 5 MG/5ML IJ SOLN
INTRAMUSCULAR | Status: DC | PRN
  Administered 2015-07-24: 2 mg via INTRAVENOUS

## 2015-07-24 MED ORDER — HYDROMORPHONE HCL 1 MG/ML IJ SOLN: 0.2500 mg | INTRAMUSCULAR | Status: DC | PRN

## 2015-07-24 MED ORDER — DIPHENHYDRAMINE HCL 12.5 MG/5ML PO ELIX: 12.5000 mg | ORAL_SOLUTION | Freq: Four times a day (QID) | ORAL | Status: DC | PRN

## 2015-07-24 MED ORDER — FENTANYL CITRATE (PF) 100 MCG/2ML IJ SOLN
INTRAMUSCULAR | Status: DC | PRN
  Administered 2015-07-24 (×4): 25 ug via INTRAVENOUS

## 2015-07-24 MED ORDER — DIPHENHYDRAMINE HCL 50 MG/ML IJ SOLN: 12.5000 mg | Freq: Four times a day (QID) | INTRAMUSCULAR | Status: DC | PRN

## 2015-07-24 MED ORDER — DEXAMETHASONE SODIUM PHOSPHATE 10 MG/ML IJ SOLN
INTRAMUSCULAR | Status: AC
  Filled 2015-07-24: qty 1

## 2015-07-24 MED ORDER — BRIMONIDINE TARTRATE 0.15 % OP SOLN
1.0000 [drp] | Freq: Two times a day (BID) | OPHTHALMIC | Status: DC
  Filled 2015-07-24: qty 5

## 2015-07-24 MED ORDER — TIMOLOL MALEATE 0.5 % OP SOLN
1.0000 [drp] | Freq: Every day | OPHTHALMIC | Status: DC
  Filled 2015-07-24: qty 5

## 2015-07-24 MED ORDER — DOCUSATE SODIUM 100 MG PO CAPS
100.0000 mg | ORAL_CAPSULE | Freq: Two times a day (BID) | ORAL | Status: DC
  Administered 2015-07-24 – 2015-07-25 (×3): 100 mg via ORAL
  Filled 2015-07-24 (×3): qty 1

## 2015-07-24 MED ORDER — BACITRACIN-NEOMYCIN-POLYMYXIN 400-5-5000 EX OINT: 1.0000 "application " | TOPICAL_OINTMENT | Freq: Three times a day (TID) | CUTANEOUS | Status: DC | PRN

## 2015-07-24 MED ORDER — CEFAZOLIN SODIUM-DEXTROSE 2-4 GM/100ML-% IV SOLN
2.0000 g | INTRAVENOUS | Status: AC
  Administered 2015-07-24: 2 g via INTRAVENOUS
  Filled 2015-07-24: qty 100

## 2015-07-24 MED ORDER — PHENAZOPYRIDINE HCL 95 MG PO TABS
95.0000 mg | ORAL_TABLET | Freq: Three times a day (TID) | ORAL | Status: AC | PRN
Start: 2015-07-24 — End: ?

## 2015-07-24 MED ORDER — TRAMADOL HCL 50 MG PO TABS: 100.0000 mg | ORAL_TABLET | Freq: Four times a day (QID) | ORAL | Status: AC | PRN

## 2015-07-24 MED ORDER — LATANOPROST 0.005 % OP SOLN
1.0000 [drp] | Freq: Every day | OPHTHALMIC | Status: DC
  Filled 2015-07-24: qty 2.5

## 2015-07-24 MED ORDER — LIDOCAINE HCL (CARDIAC) 20 MG/ML IV SOLN
INTRAVENOUS | Status: AC
  Filled 2015-07-24: qty 5

## 2015-07-24 MED ORDER — OXYCODONE-ACETAMINOPHEN 5-325 MG PO TABS: 1.0000 | ORAL_TABLET | ORAL | Status: DC | PRN

## 2015-07-24 MED ORDER — CEFAZOLIN SODIUM-DEXTROSE 2-4 GM/100ML-% IV SOLN
INTRAVENOUS | Status: AC
  Filled 2015-07-24: qty 100

## 2015-07-24 MED ORDER — PROPOFOL 10 MG/ML IV BOLUS
INTRAVENOUS | Status: DC | PRN
  Administered 2015-07-24: 200 mg via INTRAVENOUS

## 2015-07-24 MED ORDER — PROPOFOL 10 MG/ML IV BOLUS
INTRAVENOUS | Status: AC
  Filled 2015-07-24: qty 20

## 2015-07-24 MED ORDER — LACTATED RINGERS IV SOLN
INTRAVENOUS | Status: DC | PRN
  Administered 2015-07-24: 07:00:00 via INTRAVENOUS

## 2015-07-24 MED ORDER — ONDANSETRON HCL 4 MG/2ML IJ SOLN
INTRAMUSCULAR | Status: AC
  Filled 2015-07-24: qty 2

## 2015-07-24 MED ORDER — SODIUM CHLORIDE 0.9 % IV SOLN
INTRAVENOUS | Status: AC
  Administered 2015-07-24: 125 mL/h via INTRAVENOUS
  Administered 2015-07-24: 1000 mL via INTRAVENOUS

## 2015-07-24 MED ORDER — ONDANSETRON HCL 4 MG/2ML IJ SOLN: 4.0000 mg | INTRAMUSCULAR | Status: DC | PRN

## 2015-07-24 MED ORDER — DEXAMETHASONE SODIUM PHOSPHATE 4 MG/ML IJ SOLN
INTRAMUSCULAR | Status: DC | PRN
  Administered 2015-07-24: 10 mg via INTRAVENOUS

## 2015-07-24 MED ORDER — SODIUM CHLORIDE 0.9 % IR SOLN
Status: DC | PRN
  Administered 2015-07-24: 27000 mL

## 2015-07-24 MED ORDER — LIDOCAINE HCL (CARDIAC) 20 MG/ML IV SOLN
INTRAVENOUS | Status: DC | PRN
  Administered 2015-07-24: 50 mg via INTRAVENOUS

## 2015-07-24 MED ORDER — OXYCODONE HCL 5 MG PO TABS: 5.0000 mg | ORAL_TABLET | Freq: Once | ORAL | Status: DC | PRN

## 2015-07-24 MED ORDER — FENTANYL CITRATE (PF) 100 MCG/2ML IJ SOLN
INTRAMUSCULAR | Status: AC
  Filled 2015-07-24: qty 2

## 2015-07-24 MED ORDER — TAMSULOSIN HCL 0.4 MG PO CAPS
0.4000 mg | ORAL_CAPSULE | Freq: Every day | ORAL | Status: DC
  Administered 2015-07-25: 0.4 mg via ORAL
  Filled 2015-07-24: qty 1

## 2015-07-24 SURGICAL SUPPLY — 16 items
BAG URINE DRAINAGE (UROLOGICAL SUPPLIES) ×3 IMPLANT
BAG URO CATCHER STRL LF (MISCELLANEOUS) ×3 IMPLANT
CATH FOLEY 3WAY 30CC 22FR (CATHETERS) ×3 IMPLANT
ELECT REM PT RETURN 9FT ADLT (ELECTROSURGICAL) ×3
ELECTRODE REM PT RTRN 9FT ADLT (ELECTROSURGICAL) ×1 IMPLANT
EVACUATOR MICROVAS BLADDER (UROLOGICAL SUPPLIES) IMPLANT
GLOVE BIOGEL M STRL SZ7.5 (GLOVE) ×3 IMPLANT
GOWN STRL REUS W/TWL XL LVL3 (GOWN DISPOSABLE) ×3 IMPLANT
HOLDER FOLEY CATH W/STRAP (MISCELLANEOUS) ×3 IMPLANT
LOOP CUT BIPOLAR 24F LRG (ELECTROSURGICAL) ×3 IMPLANT
MANIFOLD NEPTUNE II (INSTRUMENTS) ×3 IMPLANT
PACK CYSTO (CUSTOM PROCEDURE TRAY) ×3 IMPLANT
SYR 30ML LL (SYRINGE) IMPLANT
SYRINGE IRR TOOMEY STRL 70CC (SYRINGE) ×3 IMPLANT
TUBING CONNECTING 10 (TUBING) ×2 IMPLANT
TUBING CONNECTING 10' (TUBING) ×1

## 2015-07-24 NOTE — Op Note (Signed)
Preoperative diagnoses: BPH, bladder outlet obstruction and elevated PSA Postoperative diagnosis: Same  Procedure: Exam under anesthesia, transurethral resection of prostate  Surgeon: Mena GoesEskridge  Anesthesia: Gen.  Indication for procedure: 60 year old with symptomatic BPH on combination medical therapy. Anatomy showed obstructing median lobe favorable for surgical resection with good chance of success. Discussed nature risks benefits and alternatives to TURP with patient and he elected to proceed.  Findings: On exam under anesthesia the penis was circumcised and without mass or lesion. The testicles were descended bilaterally and palpably normal. On digital rectal exam the prostate was smooth without hard area or nodule. All landmarks were preserved.  On cystoscopy the urethra was normal apart from a larger caliber stricture at the distal dilated bulb which was dilated by the scope. The prostatic urethra was obstructed by an impressive median lobe which was difficult to get over with the scope. High bladder neck as well. The bladder itself was a good capacity with a left Hutch diverticulum. Some mild trabeculation and cellules. No tumors on the mucosa. No stones or foreign bodies in the bladder.  Description of procedure: After consent was obtained patient brought to the operating room. After adequate anesthesia the patient was placed in lithotomy position. An exam under anesthesia was performed. He was prepped and draped in the usual sterile fashion. A timeout was performed to confirm the patient and procedure. A cystoscope was passed per urethra and the bladder inspected. The resectoscope sheath was then passed with the visual obturator and then the resectoscope handle was placed. Using the loop I made incisions that 5:00 and 7:00 down to the bladder neck and brought these down to the veru. The median lobe was then resected. The chips were evacuated and some residual median lobe was resected. The  prostatic fossa was open with a full bladder but with bladder draining the lateral lobes collapsed. These were then resected from anterior to posterior bladder neck to veru from the right and the left. These lobes were short and did not require a lot of resection. Hemostasis was excellent at low-pressure. All the chips were evacuated. The ureteral orifices were identified pre-and post resection and noted to be normal. There was clear efflux. The bladder was refilled and the scope removed. A 22 [place] three-way hematuria catheter was placed and left to a slow CBI drip for wake up. Patient was awakened and taken to recovery room in stable condition.  Complications: None  Blood loss: Minimal  Specimens: TURP chips to pathology  Drains: 22 French three-way catheter with 30 mL in the balloon

## 2015-07-24 NOTE — Discharge Instructions (Signed)
Transurethral Resection of the Prostate, Care After °Refer to this sheet in the next few weeks. These instructions provide you with information on caring for yourself after your procedure. Your caregiver also may give you specific instructions. Your treatment has been planned according to current medical practices, but complications sometimes occur. Call your caregiver if you have any problems or questions after your procedure. °HOME CARE INSTRUCTIONS  °Recovery can take 4-6 weeks. Avoid alcohol, caffeinated drinks, and spicy foods for 2 weeks after your procedure. Drink enough fluids to keep your urine clear or pale yellow. Urinate as soon as you feel the urge to do so. Do not try to hold your urine for long periods of time. °During recovery you may experience pain caused by bladder spasms, which result in a very intense urge to urinate. Take all medicines as directed by your caregiver, including medicines for pain. Try to limit the amount of pain medicines you take because it can cause constipation. If you do become constipated, do not strain to move your bowels. Straining can increase bleeding. Constipation can be minimized by increasing the amount fluids and fiber in your diet. Your caregiver also may prescribe a stool softener. °Do not lift heavy objects (more than 5 lb [2.25 kg]) or perform exercises that cause you to strain for at least 1 month after your procedure. When sitting, you may want to sit in a soft chair or use a cushion. For the first 10 days after your procedure, avoid the following activities: °· Running. °· Strenuous work. °· Long walks. °· Riding in a car for extended periods. °· Sex. °SEEK MEDICAL CARE IF: °· You have difficulty urinating. °· You have blood in your urine that does not go away after you rest or increase your fluid intake. °· You have swelling in your penis or scrotum. °SEEK IMMEDIATE MEDICAL CARE IF:  °· You are suddenly unable to urinate. °· You notice blood clots in your  urine. °· You have chills. °· You have a fever. °· You have pain in your back or lower abdomen. °· You have pain or swelling in your legs. °MAKE SURE YOU:  °· Understand these instructions. °· Will watch your condition. °· Will get help right away if you are not doing well or get worse. °  °This information is not intended to replace advice given to you by your health care provider. Make sure you discuss any questions you have with your health care provider. °  °Document Released: 02/24/2005 Document Revised: 03/17/2014 Document Reviewed: 04/04/2011 °Elsevier Interactive Patient Education ©2016 [employer] ° °

## 2015-07-24 NOTE — Interval H&P Note (Signed)
History and Physical Interval Note:  07/24/2015 7:26 AM  Alan Brewer  has presented today for surgery, with the diagnosis of BPH  The various methods of treatment have been discussed with the patient and family. After consideration of risks, benefits and other options for treatment, the patient has consented to  Procedure(s): TRANSURETHRAL RESECTION OF THE PROSTATE WITH GYRUS INSTRUMENTS (N/A) as a surgical intervention .  The patient's history has been reviewed, patient examined, no change in status, stable for surgery.  I have reviewed the patient's chart and labs.  Discussed need for foley post-op, possible overnight stay, CBI. He is well without gross hematuria or dysuria. Questions were answered to the patient's satisfaction.     Aerionna Moravek

## 2015-07-24 NOTE — Transfer of Care (Signed)
Immediate Anesthesia Transfer of Care Note  Patient: Alan Brewer  Procedure(s) Performed: Procedure(s): TRANSURETHRAL RESECTION OF THE PROSTATE WITH GYRUS INSTRUMENTS (N/A)  Patient Location: PACU  Anesthesia Type:General  Level of Consciousness: Patient easily awoken, sedated, comfortable, cooperative, following commands, responds to stimulation.   Airway & Oxygen Therapy: Patient spontaneously breathing, ventilating well, oxygen via simple oxygen mask.  Post-op Assessment: Report given to PACU RN, vital signs reviewed and stable, moving all extremities.   Post vital signs: Reviewed and stable.  Complications: No apparent anesthesia complications

## 2015-07-24 NOTE — Anesthesia Preprocedure Evaluation (Signed)
Anesthesia Evaluation  Patient identified by MRN, date of birth, ID band Patient awake    Reviewed: Allergy & Precautions, NPO status , Patient's Chart, lab work & pertinent test results  Airway Mallampati: II  TM Distance: >3 FB Neck ROM: Full    Dental no notable dental hx.    Pulmonary neg pulmonary ROS,    Pulmonary exam normal breath sounds clear to auscultation       Cardiovascular negative cardio ROS Normal cardiovascular exam Rhythm:Regular Rate:Normal     Neuro/Psych negative neurological ROS  negative psych ROS   GI/Hepatic negative GI ROS, Neg liver ROS,   Endo/Other  negative endocrine ROS  Renal/GU negative Renal ROS     Musculoskeletal negative musculoskeletal ROS (+)   Abdominal   Peds  Hematology negative hematology ROS (+)   Anesthesia Other Findings   Reproductive/Obstetrics                             Anesthesia Physical Anesthesia Plan  ASA: II  Anesthesia Plan: General   Post-op Pain Management:    Induction: Intravenous  Airway Management Planned: LMA  Additional Equipment:   Intra-op Plan:   Post-operative Plan: Extubation in OR  Informed Consent: I have reviewed the patients History and Physical, chart, labs and discussed the procedure including the risks, benefits and alternatives for the proposed anesthesia with the patient or authorized representative who has indicated his/her understanding and acceptance.   Dental advisory given  Plan Discussed with: CRNA  Anesthesia Plan Comments:         Anesthesia Quick Evaluation  

## 2015-07-24 NOTE — Anesthesia Procedure Notes (Signed)
Procedure Name: LMA Insertion Date/Time: 07/24/2015 7:40 AM Performed by: Ludwig LeanJONES, Nyala Kirchner C Pre-anesthesia Checklist: Patient identified, Emergency Drugs available, Suction available and Patient being monitored Patient Re-evaluated:Patient Re-evaluated prior to inductionOxygen Delivery Method: Circle system utilized Preoxygenation: Pre-oxygenation with 100% oxygen Intubation Type: IV induction Ventilation: Mask ventilation without difficulty LMA: LMA inserted LMA Size: 4.0 Number of attempts: 1 Placement Confirmation: positive ETCO2 and breath sounds checked- equal and bilateral Tube secured with: Tape Dental Injury: Teeth and Oropharynx as per pre-operative assessment

## 2015-07-24 NOTE — Anesthesia Postprocedure Evaluation (Signed)
Anesthesia Post Note  Patient: [name]  Procedure(s) Performed: Procedure(s) (LRB): TRANSURETHRAL RESECTION OF THE PROSTATE WITH GYRUS INSTRUMENTS (N/A)  Patient location during evaluation: PACU Anesthesia Type: General Level of consciousness: sedated and patient cooperative Pain management: pain level controlled Vital Signs Assessment: post-procedure vital signs reviewed and stable Respiratory status: spontaneous breathing Cardiovascular status: stable Anesthetic complications: no    Last Vitals:  Filed Vitals:   07/24/15 0928 07/24/15 0945  BP: 118/76 123/76  Pulse: 65 65  Temp: 36.7 C 36.6 C  Resp: 17 17    Last Pain:  Filed Vitals:   07/24/15 0949  PainSc: 0-No pain                 Lewie LoronJohn Chere Babson

## 2015-07-25 DIAGNOSIS — N401 Enlarged prostate with lower urinary tract symptoms: Secondary | ICD-10-CM | POA: Diagnosis not present

## 2015-07-25 NOTE — Discharge Summary (Signed)
Physician Discharge Summary  Patient ID: Alan Brewer MRN: [id number] DOB/AGE: 61/11/1955 61 y.o.  Admit date: 07/24/2015 Discharge date: 07/25/2015  Admission Diagnoses: BPH with LUTS  Discharge Diagnoses:  Active Problems:   BPH with obstruction/lower urinary tract symptoms   Discharged Condition: good  Hospital Course: 60 YO s/p TURP. Foley d/c'd this AM. Voided about 200 ml light pink - red urine, no clots. Ambulating. Tolerating regular diet. Stable overnight. Ready for d/c.   Consults: None  Significant Diagnostic Studies: none  Treatments: surgery: TURP  Discharge Exam: Blood pressure 116/70, pulse 58, temperature 98.2 F (36.8 C), temperature source Oral, resp. rate 17, height 5\' 7"  (1.702 m), weight 71.668 kg (158 lb), SpO2 100 %. NAD, looks well Sitting in bed A&Ox3 No focal deficits Urine in urinal - light red - pink, no clots  Disposition: 01-Home or Self Care     Medication List    TAKE these medications        brimonidine 0.1 % Soln  Commonly known as:  ALPHAGAN P  Place 1 drop into both eyes 2 (two) times daily.     cephALEXin 500 MG capsule  Commonly known as:  KEFLEX  Take 1 capsule (500 mg total) by mouth 2 (two) times daily.     finasteride 5 MG tablet  Commonly known as:  PROSCAR  Take 5 mg by mouth daily.     latanoprost 0.005 % ophthalmic solution  Commonly known as:  XALATAN  Place 1 drop into both eyes at bedtime.     phenazopyridine 95 MG tablet  Commonly known as:  PYRIDIUM  Take 1 tablet (95 mg total) by mouth 3 (three) times daily as needed for pain.     tamsulosin 0.4 MG Caps capsule  Commonly known as:  FLOMAX  Take 0.4 mg by mouth daily.     timolol 0.5 % ophthalmic solution  Commonly known as:  BETIMOL  Place 1 drop into the left eye daily.     traMADol 50 MG tablet  Commonly known as:  ULTRAM  Take 2 tablets (100 mg total) by mouth every 6 (six) hours as needed.           Follow-up Information    Follow  up with Jerilee FieldESKRIDGE, Graydon Fofana, MD.   Specialty:  Urology   Why:  1-2 weeks    Contact information:   [address] [place] [address] [phone]       Signed: Jerilee FieldSKRIDGE, Donley Harland 07/25/2015, 8:04 AM
